# Patient Record
Sex: Male | Born: 1985 | Hispanic: Yes | Marital: Married | Smoking: Never smoker
Health system: Southern US, Community
[De-identification: ages and names within clinical notes are randomized; demographics above are authoritative.]

---

## 2017-07-05 ENCOUNTER — Emergency Department
Admission: EM | Admit: 2017-07-05 | Discharge: 2017-07-05 | Disposition: A | Discharge status: Home / Self Care | Payer: Self-pay | Attending: Emergency Medicine | Admitting: Emergency Medicine

## 2017-07-05 ENCOUNTER — Other Ambulatory Visit: Payer: Self-pay

## 2017-07-05 DIAGNOSIS — Y939 Activity, unspecified: Secondary | ICD-10-CM | POA: Insufficient documentation

## 2017-07-05 DIAGNOSIS — T1592XA Foreign body on external eye, part unspecified, left eye, initial encounter: Secondary | ICD-10-CM | POA: Insufficient documentation

## 2017-07-05 DIAGNOSIS — S0502XA Injury of conjunctiva and corneal abrasion without foreign body, left eye, initial encounter: Secondary | ICD-10-CM | POA: Insufficient documentation

## 2017-07-05 DIAGNOSIS — Y929 Unspecified place or not applicable: Secondary | ICD-10-CM | POA: Insufficient documentation

## 2017-07-05 DIAGNOSIS — X58XXXA Exposure to other specified factors, initial encounter: Secondary | ICD-10-CM | POA: Insufficient documentation

## 2017-07-05 DIAGNOSIS — Y999 Unspecified external cause status: Secondary | ICD-10-CM | POA: Insufficient documentation

## 2017-07-05 MED ORDER — TETRACAINE HCL 0.5 % OP SOLN
1.0000 [drp] | Freq: Once | OPHTHALMIC | Status: DC
  Filled 2017-07-05: qty 4

## 2017-07-05 MED ORDER — DICLOFENAC SODIUM 0.1 % OP SOLN: 1.0000 [drp] | Freq: Four times a day (QID) | OPHTHALMIC | 0 refills | Status: AC

## 2017-07-05 MED ORDER — POLYMYXIN B-TRIMETHOPRIM 10000-0.1 UNIT/ML-% OP SOLN: 1.0000 [drp] | OPHTHALMIC | 0 refills | Status: DC

## 2017-07-05 MED ORDER — DICLOFENAC SODIUM 0.1 % OP SOLN: 1.0000 [drp] | Freq: Four times a day (QID) | OPHTHALMIC | 0 refills | Status: DC

## 2017-07-05 MED ORDER — FLUORESCEIN SODIUM 1 MG OP STRP
1.0000 | ORAL_STRIP | Freq: Once | OPHTHALMIC | Status: DC
  Filled 2017-07-05: qty 1

## 2017-07-05 MED ORDER — POLYMYXIN B-TRIMETHOPRIM 10000-0.1 UNIT/ML-% OP SOLN: 1.0000 [drp] | OPHTHALMIC | 0 refills | Status: AC

## 2017-07-05 NOTE — ED Provider Notes (Signed)
[facility] Emergency Department Provider Note  ____________________________________________  Time seen: Approximately 10:52 PM  I have reviewed the triage vital signs and the nursing notes.   HISTORY  Chief Complaint Eye Pain    HPI Matthew Archer is a 32 y.o. male presents to the emergency department with foreign body sensation of the left eye since dust and dirt flew into his eye yesterday.  Patient has been washing out his eyes numerous times and has been using over-the-counter eyedrops.  Patient reports that he thinks he can see something "in his pupil".  He has experienced increased tearing and mild photophobia but no nausea or vomiting.  Patient does not wear contact lenses or glasses.   History reviewed. No pertinent past medical history.  There are no active problems to display for this patient.   History reviewed. No pertinent surgical history.  Prior to Admission medications   Medication Sig Start Date End Date Taking? Authorizing Provider  diclofenac (VOLTAREN) 0.1 % ophthalmic solution Place 1 drop into the left eye 4 (four) times daily for 5 days. 07/05/17 07/10/17  Orvil Feil, PA-C  trimethoprim-polymyxin b (POLYTRIM) ophthalmic solution Place 1 drop into the left eye every 4 (four) hours for 7 days. 07/05/17 07/12/17  Orvil Feil, PA-C    Allergies Patient has no known allergies.  No family history on file.  Social History Social History   Tobacco Use  . Smoking status: Never Smoker  . Smokeless tobacco: Never Used  Substance Use Topics  . Alcohol use: Not Currently  . Drug use: Never     Review of Systems  Constitutional: No fever/chills Eyes: Patient has left eye foreign body.  ENT: No upper respiratory complaints. Cardiovascular: no chest pain. Respiratory: no cough. No SOB. Gastrointestinal: No abdominal pain.  No nausea, no vomiting.  No diarrhea.  No constipation. Genitourinary: Negative for dysuria.  No hematuria. Musculoskeletal: Negative for musculoskeletal pain. Skin: Negative for rash, abrasions, lacerations, ecchymosis. Neurological: Negative for headaches, focal weakness or numbness.   ____________________________________________   PHYSICAL EXAM:  VITAL SIGNS: ED Triage Vitals  Enc Vitals Group     BP 07/05/17 2036 133/81     Pulse Rate 07/05/17 2036 91     Resp 07/05/17 2036 17     Temp 07/05/17 2036 99.2 F (37.3 C)     Temp Source 07/05/17 2036 Oral     SpO2 07/05/17 2036 100 %     Weight 07/05/17 2039 162 lb (73.5 kg)     Height 07/05/17 2039 5\' 8"  (1.727 m)     Head Circumference --      Peak Flow --      Pain Score 07/05/17 2038 9     Pain Loc --      Pain Edu? --      Excl. in GC? --      Constitutional: Alert and oriented. Well appearing and in no acute distress. Eyes: A small region of fluorescein uptake was visualized with foreign body of left eye.  PERRL. EOMI. Head: Atraumatic. ENT:      Nose: No congestion/rhinnorhea.      Mouth/Throat: Mucous membranes are moist.  Neck: No stridor.  No cervical spine tenderness to palpation. Cardiovascular: Normal rate, regular rhythm. Normal S1 and S2.  Good peripheral circulation. Respiratory: Normal respiratory effort without tachypnea or retractions. Lungs CTAB. Good air entry to the bases with no decreased or absent breath sounds. Musculoskeletal: Full range of motion to all extremities. No  gross deformities appreciated. Neurologic:  Normal speech and language. No gross focal neurologic deficits are appreciated.  Skin:  Skin is warm, dry and intact. No rash noted.  ____________________________________________   LABS (all labs ordered are listed, but only abnormal results are displayed)  Labs Reviewed - No data to display ____________________________________________  EKG   ____________________________________________  RADIOLOGY   No results  found.  ____________________________________________    PROCEDURES  Procedure(s) performed:    Procedures  With use of translator, patient gave consent for the following procedure.  Tetracaine eyedrops were used to anesthetize the left eye.  Fluorescein staining revealed a small foreign body in region of uptake.  Foreign body was removed using 18-gauge needle without complication.  Medications  tetracaine (PONTOCAINE) 0.5 % ophthalmic solution 1 drop (has no administration in time range)  fluorescein ophthalmic strip 1 strip (has no administration in time range)     ____________________________________________   INITIAL IMPRESSION / ASSESSMENT AND PLAN / ED COURSE  Pertinent labs & imaging results that were available during my care of the patient were reviewed by me and considered in my medical decision making (see chart for details).  Review of the [place] CSRS was performed in accordance of the [facility] prior to dispensing any controlled drugs.      Assessment and plan Corneal abrasion Left eye foreign body Patient presents to the emergency department with a left eye foreign body that was removed in the emergency department without complication.  A small region of fluorescein uptake was revealed with fluorescein staining, increasing suspicion for corneal abrasion.  Patient was discharged with Polytrim ophthalmic solution as well as diclofenac ophthalmic solution.  He was advised to follow-up with primary care as needed.  All patient questions were answered.     ____________________________________________  FINAL CLINICAL IMPRESSION(S) / ED DIAGNOSES  Final diagnoses:  Abrasion of left cornea, initial encounter  Foreign body of left eye, initial encounter      NEW MEDICATIONS STARTED DURING THIS VISIT:  ED Discharge Orders        Ordered    trimethoprim-polymyxin b (POLYTRIM) ophthalmic solution  Every 4 hours     07/05/17 2245    diclofenac (VOLTAREN) 0.1 %  ophthalmic solution  4 times daily     07/05/17 2245          This chart was dictated using voice recognition software/Dragon. Despite best efforts to proofread, errors can occur which can change the meaning. Any change was purely unintentional.    Orvil FeilWoods, Terence Googe M, PA-C 07/05/17 2258    Phineas SemenGoodman, [place], MD 07/05/17 [phone]

## 2017-07-05 NOTE — ED Notes (Signed)
Visual acuity test yields 20/40 L and 20/20 R

## 2017-07-05 NOTE — ED Triage Notes (Signed)
Patient was at work yesterday when the wind picked up and blew sand into his left eye. Since then has had progressive pain and can see something in the visual field. Has been rinsing his eye but also rubbing it.

## 2019-12-29 ENCOUNTER — Emergency Department: Payer: Self-pay

## 2019-12-29 ENCOUNTER — Emergency Department
Admission: EM | Admit: 2019-12-29 | Discharge: 2019-12-29 | Disposition: A | Discharge status: Home / Self Care | Payer: Self-pay | Attending: Emergency Medicine | Admitting: Emergency Medicine

## 2019-12-29 ENCOUNTER — Other Ambulatory Visit: Payer: Self-pay

## 2019-12-29 DIAGNOSIS — S20211A Contusion of right front wall of thorax, initial encounter: Secondary | ICD-10-CM | POA: Insufficient documentation

## 2019-12-29 DIAGNOSIS — Y990 Civilian activity done for income or pay: Secondary | ICD-10-CM | POA: Insufficient documentation

## 2019-12-29 DIAGNOSIS — W228XXA Striking against or struck by other objects, initial encounter: Secondary | ICD-10-CM | POA: Insufficient documentation

## 2019-12-29 MED ORDER — LIDOCAINE 5 % EX PTCH
1.0000 | MEDICATED_PATCH | CUTANEOUS | Status: DC
  Administered 2019-12-29: 1 via TRANSDERMAL
  Filled 2019-12-29: qty 1

## 2019-12-29 MED ORDER — TRAMADOL HCL 50 MG PO TABS: 50.0000 mg | ORAL_TABLET | Freq: Four times a day (QID) | ORAL | 0 refills | Status: AC | PRN

## 2019-12-29 MED ORDER — NAPROXEN 500 MG PO TABS: 500.0000 mg | ORAL_TABLET | Freq: Two times a day (BID) | ORAL | Status: AC

## 2019-12-29 MED ORDER — LIDOCAINE 5 % EX PTCH: 1.0000 | MEDICATED_PATCH | Freq: Two times a day (BID) | CUTANEOUS | 0 refills | Status: AC

## 2019-12-29 NOTE — Discharge Instructions (Addendum)
No rib fractures on x-ray.  Follow discharge care instruction take medication as directed.  Do not drive or operate machinery while taking tramadol.

## 2019-12-29 NOTE — ED Provider Notes (Signed)
[facility] Emergency Department Provider Note   ____________________________________________   Event Date/Time   First MD Initiated Contact with Patient 12/29/19 1104     (approximate)  I have reviewed the triage vital signs and the nursing notes.   HISTORY Via interpreter Chief Complaint No chief complaint on file.    HPI Matthew Archer is a 34 y.o. male patient complain of right lateral rib pain secondary to contusion 3 days ago at work.  Patient state he developed some still rides on the ground and one bounced back and hit him in the ribs.  Patient states pain increased with deep inspiration or palpation.  Rates pain as a 7/10.  Described pain as "achy".  No palliative measures for complaint.         History reviewed. No pertinent past medical history.  There are no problems to display for this patient.   History reviewed. No pertinent surgical history.  Prior to Admission medications   Medication Sig Start Date End Date Taking? Authorizing Provider  lidocaine (LIDODERM) 5 % Place 1 patch onto the skin every 12 (twelve) hours. Remove & Discard patch within 12 hours or as directed by MD 12/29/19 12/28/20  Joni Reining, PA-C  naproxen (NAPROSYN) 500 MG tablet Take 1 tablet (500 mg total) by mouth 2 (two) times daily with a meal. 12/29/19   Joni Reining, PA-C  traMADol (ULTRAM) 50 MG tablet Take 1 tablet (50 mg total) by mouth every 6 (six) hours as needed for moderate pain. Do not take tramadol while driving or working. 12/29/19   Joni Reining, PA-C    Allergies Patient has no known allergies.  History reviewed. No pertinent family history.  Social History Social History   Tobacco Use  . Smoking status: Never Smoker  . Smokeless tobacco: Never Used  Substance Use Topics  . Alcohol use: Not Currently  . Drug use: Never    Review of Systems Constitutional: No fever/chills Eyes: No visual changes. ENT: No sore  throat. Cardiovascular: Denies chest pain. Respiratory: Denies shortness of breath. Gastrointestinal: No abdominal pain.  No nausea, no vomiting.  No diarrhea.  No constipation. Genitourinary: Negative for dysuria. Musculoskeletal: Right lateral rib pain. Skin: Negative for rash. Neurological: Negative for headaches, focal weakness or numbness.   ____________________________________________   PHYSICAL EXAM:  VITAL SIGNS: ED Triage Vitals  Enc Vitals Group     BP      Pulse      Resp      Temp      Temp src      SpO2      Weight      Height      Head Circumference      Peak Flow      Pain Score      Pain Loc      Pain Edu?      Excl. in GC?     Constitutional: Alert and oriented. Well appearing and in no acute distress. Cardiovascular: Normal rate, regular rhythm. Grossly normal heart sounds.  Good peripheral circulation. Respiratory: Splinting with respiratory effort.  No retractions. Lungs CTAB. Gastrointestinal: Soft and nontender. No distention. No abdominal bruits. No CVA tenderness. Musculoskeletal: No lower extremity tenderness nor edema.  No joint effusions. Neurologic:  Normal speech and language. No gross focal neurologic deficits are appreciated. No gait instability. Skin:  Skin is warm, dry and intact. No rash noted.  No abrasion or ecchymosis. Psychiatric: Mood and affect are  normal. Speech and behavior are normal.  ____________________________________________   LABS (all labs ordered are listed, but only abnormal results are displayed)  Labs Reviewed - No data to display ____________________________________________  EKG   ____________________________________________  RADIOLOGY I, Joni Reining, personally viewed and evaluated these images (plain radiographs) as part of my medical decision making, as well as reviewing the written report by the radiologist.  ED MD interpretation: No acute findings on x-ray of the right lateral ribs. Official  radiology report(s): DG Ribs Unilateral W/Chest Right  Result Date: 12/29/2019 CLINICAL DATA:  Pain secondary to blunt trauma 4 days ago. Right side. Status post hit by metal bar. EXAM: RIGHT RIBS AND CHEST - 3+ VIEW COMPARISON:  None. FINDINGS: BB marker noted at the level of the right eleventh and twelfth ribs laterally. No fracture or other bone lesions are seen involving the ribs. The heart size and mediastinal contours are within normal limits. Biapical trace pleural/pulmonary scarring. No focal consolidation. No pulmonary edema. No pleural effusion. No pneumothorax. No acute osseous abnormality. IMPRESSION: 1. No acute displaced right rib fracture. 2. No acute cardiopulmonary abnormality. Electronically Signed   By: Tish Frederickson M.D.   On: 12/29/2019 12:22    ____________________________________________   PROCEDURES  Procedure(s) performed (including Critical Care):  Procedures   ____________________________________________   INITIAL IMPRESSION / ASSESSMENT AND PLAN / ED COURSE  As part of my medical decision making, I reviewed the following data within the electronic MEDICAL RECORD NUMBER         Patient presents with left rib pain secondary to blunt trauma 3 days ago.  Patient was concerned for rib fracture.  Discussed no obvious rib fracture on x-ray of the right lateral rib.  Patient complaining physical exam consistent with rib contusion.  Patient given discharge care instruction advised take medication as directed.  [facility]      ____________________________________________   FINAL CLINICAL IMPRESSION(S) / ED DIAGNOSES  Final diagnoses:  Rib contusion, right, initial encounter     ED Discharge Orders         Ordered    naproxen (NAPROSYN) 500 MG tablet  2 times daily with meals        12/29/19 1258    lidocaine (LIDODERM) 5 %  Every 12 hours        12/29/19 1258    traMADol (ULTRAM) 50 MG tablet   Every 6 hours PRN        12/29/19 1258          *Please note:  Matthew Archer was evaluated in Emergency Department on 12/29/2019 for the symptoms described in the history of present illness. He was evaluated in the context of the global COVID-19 pandemic, which necessitated consideration that the patient might be at risk for infection with the SARS-CoV-2 virus that causes COVID-19. Institutional protocols and algorithms that pertain to the evaluation of patients at risk for COVID-19 are in a state of rapid change based on information released by regulatory bodies including the CDC and federal and state organizations. These policies and algorithms were followed during the patient's care in the ED.  Some ED evaluations and interventions may be delayed as a result of limited staffing during and the pandemic.*   Note:  This document was prepared using Dragon voice recognition software and may include unintentional dictation errors.    Joni Reining, PA-C 12/29/19 1300    Sharman Cheek, MD 12/29/19 [phone]

## 2019-12-29 NOTE — ED Triage Notes (Signed)
Pt to ED via POV from work. Pt stating he hit himself at work with a piece of iron on Saturday and continued to work. Pt stating increasing pain that has made it difficult to move. Pt stating pain 10/10. Pt stating this is not workers comp.

## 2021-12-18 IMAGING — CR DG RIBS W/ CHEST 3+V*R*
1 series · 5 of 5 positions shown · non-contrast
Comparison: None.

CLINICAL DATA: Pain secondary to blunt trauma 4 days ago. Right
side. Status post hit by metal bar.

EXAM:
RIGHT RIBS AND CHEST - 3+ VIEW

[Series 1: dg ribs unilateral w/chest right · 0.14mm/px · 5 of 5 slices shown]
[im 1/5]
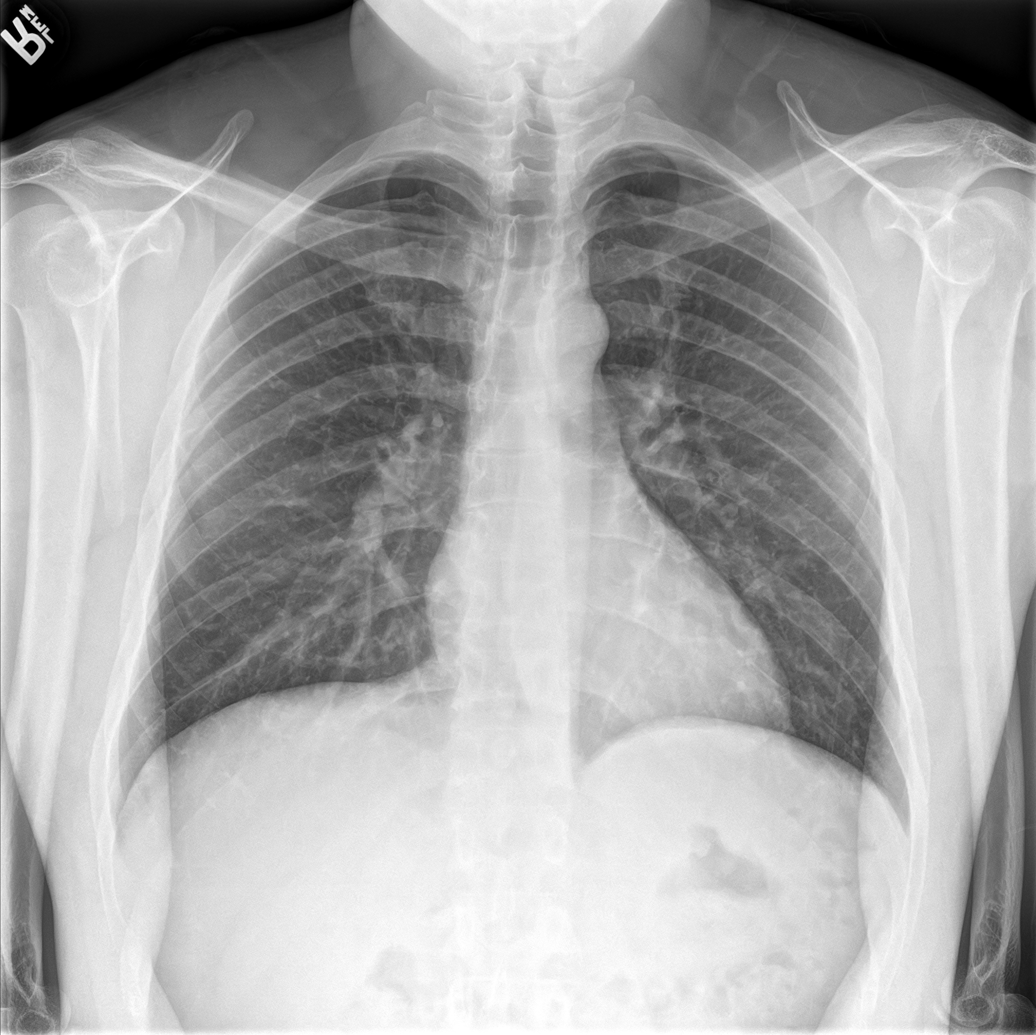
[im 2/5]
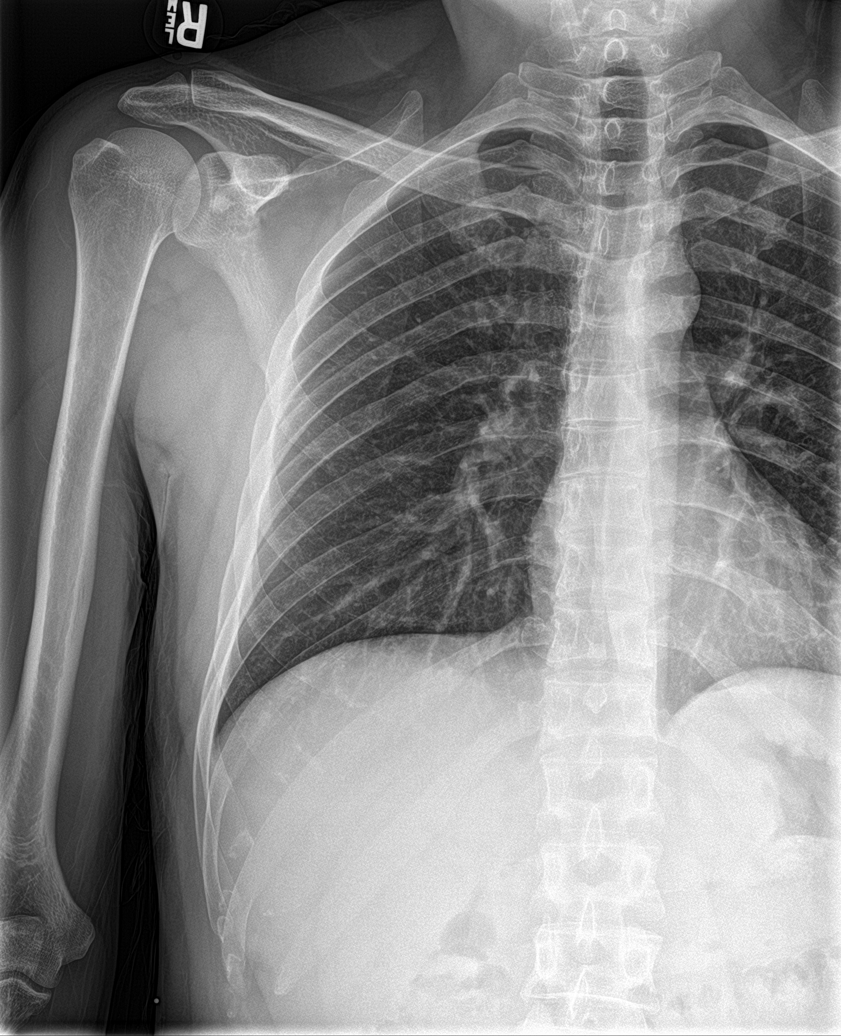
[im 3/5]
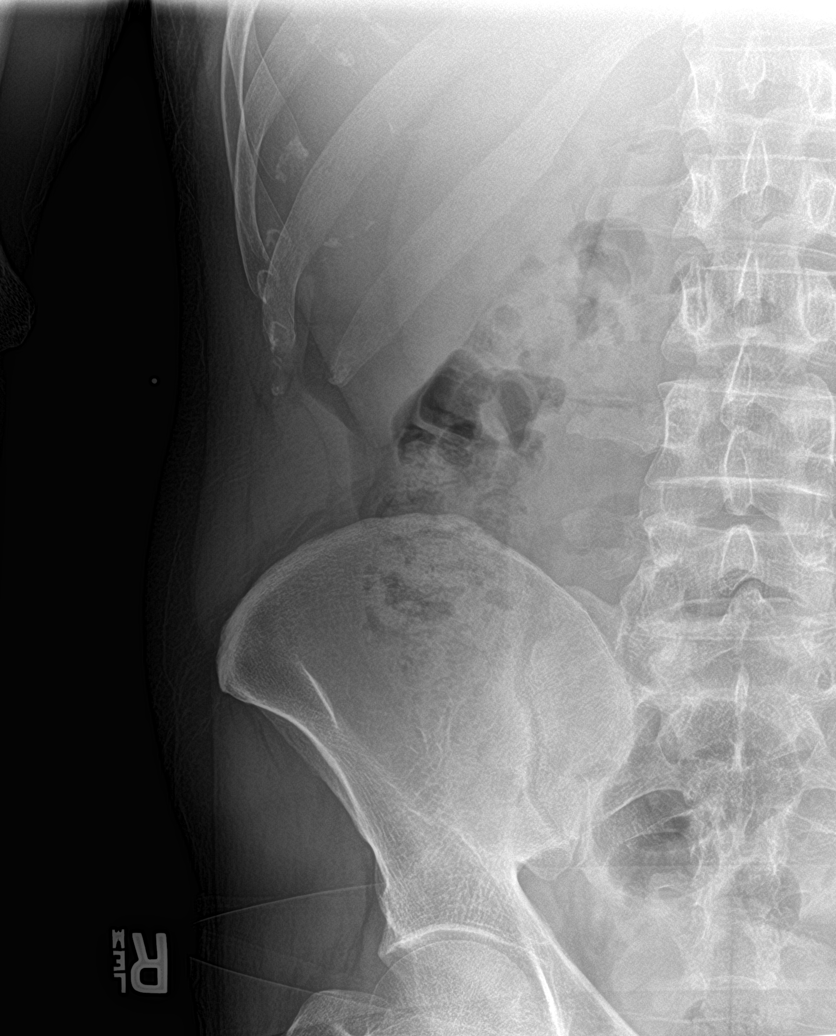
[im 4/5]
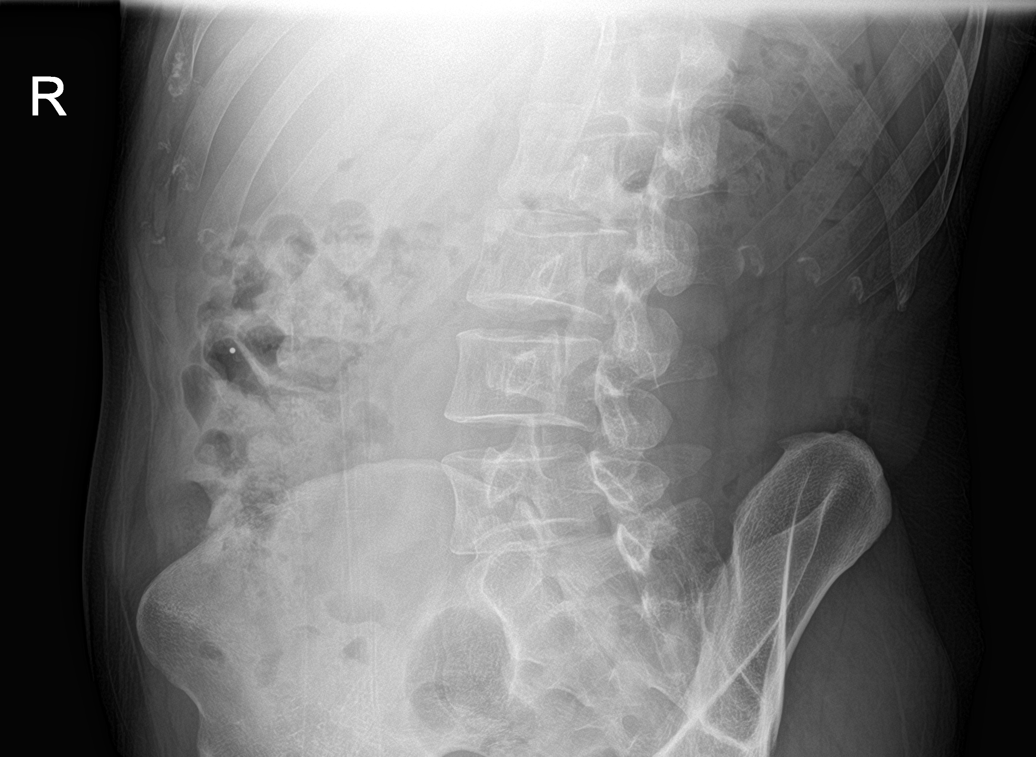
[im 5/5]
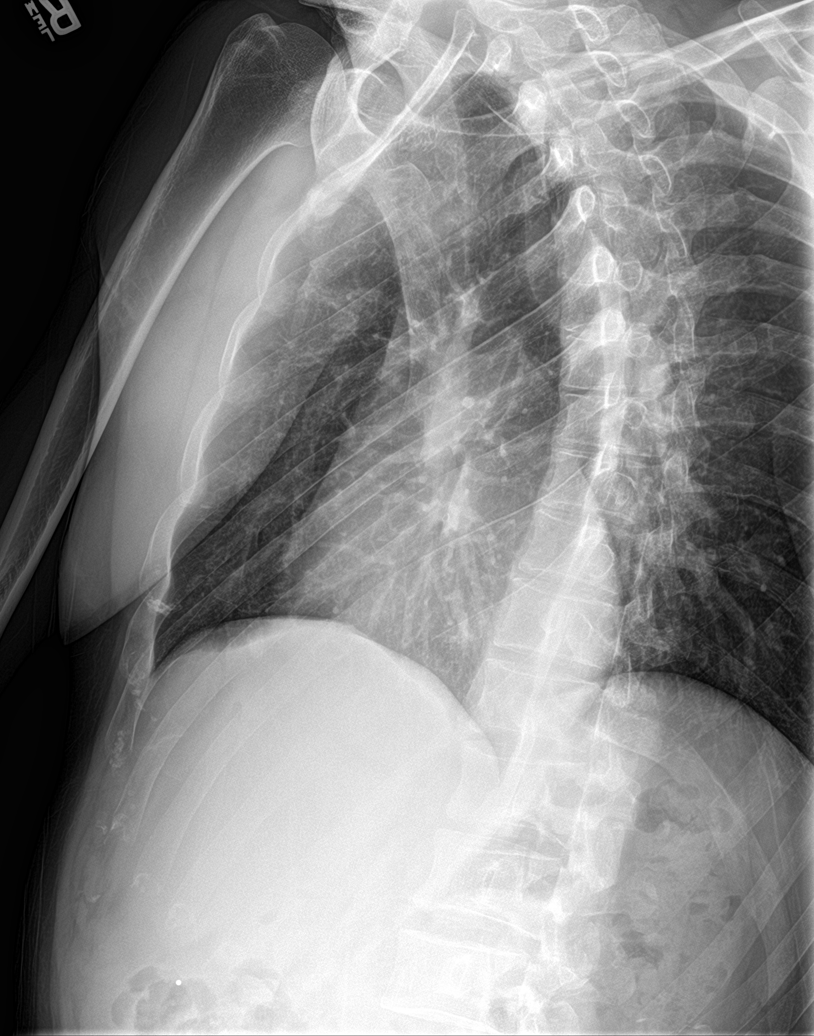

[5 of 5 positions shown; findings below may reference images not displayed]

FINDINGS: BB marker noted at the level of the right eleventh and twelfth ribs
laterally. No fracture or other bone lesions are seen involving the
ribs.

The heart size and mediastinal contours are within normal limits.

Biapical trace pleural/pulmonary scarring. No focal consolidation.
No pulmonary edema. No pleural effusion. No pneumothorax.

No acute osseous abnormality.
IMPRESSION: 1. No acute displaced right rib fracture.
2. No acute cardiopulmonary abnormality.
